# Patient Record
Sex: Male | Born: 1975 | Race: Black or African American | Hispanic: No | Marital: Married | Smoking: Never smoker
Health system: Southern US, Community
[De-identification: ages and names within clinical notes are randomized; demographics above are authoritative.]

---

## 2020-06-18 ENCOUNTER — Emergency Department (HOSPITAL_BASED_OUTPATIENT_CLINIC_OR_DEPARTMENT_OTHER)
Admission: EM | Admit: 2020-06-18 | Discharge: 2020-06-18 | Disposition: A | Discharge status: Home / Self Care | Payer: No Typology Code available for payment source | Attending: Emergency Medicine | Admitting: Emergency Medicine

## 2020-06-18 ENCOUNTER — Other Ambulatory Visit: Payer: Self-pay

## 2020-06-18 ENCOUNTER — Emergency Department (HOSPITAL_BASED_OUTPATIENT_CLINIC_OR_DEPARTMENT_OTHER): Payer: No Typology Code available for payment source

## 2020-06-18 ENCOUNTER — Encounter (HOSPITAL_BASED_OUTPATIENT_CLINIC_OR_DEPARTMENT_OTHER): Payer: Self-pay | Admitting: Emergency Medicine

## 2020-06-18 DIAGNOSIS — M546 Pain in thoracic spine: Secondary | ICD-10-CM | POA: Insufficient documentation

## 2020-06-18 DIAGNOSIS — Y92410 Unspecified street and highway as the place of occurrence of the external cause: Secondary | ICD-10-CM | POA: Diagnosis not present

## 2020-06-18 DIAGNOSIS — M545 Low back pain, unspecified: Secondary | ICD-10-CM | POA: Diagnosis not present

## 2020-06-18 DIAGNOSIS — M542 Cervicalgia: Secondary | ICD-10-CM | POA: Diagnosis not present

## 2020-06-18 DIAGNOSIS — M25562 Pain in left knee: Secondary | ICD-10-CM | POA: Diagnosis not present

## 2020-06-18 DIAGNOSIS — R519 Headache, unspecified: Secondary | ICD-10-CM | POA: Insufficient documentation

## 2020-06-18 MED ORDER — IBUPROFEN 600 MG PO TABS: 600.0000 mg | ORAL_TABLET | Freq: Four times a day (QID) | ORAL | 0 refills | Status: AC | PRN

## 2020-06-18 MED ORDER — CYCLOBENZAPRINE HCL 10 MG PO TABS: 10.0000 mg | ORAL_TABLET | Freq: Two times a day (BID) | ORAL | 0 refills | Status: AC | PRN
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
